# Patient Record
Sex: Female | Born: 1970 | Race: White | Hispanic: No
Health system: Southern US, Community
[De-identification: ages and names within clinical notes are randomized; demographics above are authoritative.]

---

## 2012-11-26 ENCOUNTER — Inpatient Hospital Stay: Payer: Self-pay | Admitting: Surgery

## 2012-11-26 LAB — CBC
HCT: 46.6 % (ref 35.0–47.0)
MCH: 30.2 pg (ref 26.0–34.0)
MCV: 89 fL (ref 80–100)
Platelet: 280 10*3/uL (ref 150–440)
RBC: 5.22 10*6/uL — ABNORMAL HIGH (ref 3.80–5.20)
RDW: 12.7 % (ref 11.5–14.5)
WBC: 9.6 10*3/uL (ref 3.6–11.0)

## 2012-11-26 LAB — URINALYSIS, COMPLETE
Bilirubin,UR: NEGATIVE
Blood: NEGATIVE
Glucose,UR: NEGATIVE mg/dL (ref 0–75)
Ph: 5 (ref 4.5–8.0)
Protein: 30
RBC,UR: NONE SEEN /HPF (ref 0–5)
Specific Gravity: 1.035 (ref 1.003–1.030)

## 2012-11-26 LAB — COMPREHENSIVE METABOLIC PANEL
Albumin: 3.7 g/dL (ref 3.4–5.0)
Alkaline Phosphatase: 86 U/L (ref 50–136)
Calcium, Total: 8.4 mg/dL — ABNORMAL LOW (ref 8.5–10.1)
Creatinine: 0.87 mg/dL (ref 0.60–1.30)
EGFR (Non-African Amer.): >60
Osmolality: 276 (ref 275–301)
Potassium: 3.3 mmol/L — ABNORMAL LOW (ref 3.5–5.1)
SGPT (ALT): 38 U/L (ref 12–78)

## 2012-11-26 LAB — LIPASE, BLOOD: Lipase: 97 U/L (ref 73–393)

## 2012-11-28 LAB — CBC WITH DIFFERENTIAL/PLATELET
Basophil #: 0 10*3/uL (ref 0.0–0.1)
Basophil %: 0.3 %
Eosinophil #: 0 10*3/uL (ref 0.0–0.7)
HCT: 41.9 % (ref 35.0–47.0)
HGB: 14 g/dL (ref 12.0–16.0)
Lymphocyte #: 1.4 10*3/uL (ref 1.0–3.6)
Lymphocyte %: 9.7 %
MCHC: 33.4 g/dL (ref 32.0–36.0)
MCV: 90 fL (ref 80–100)
Monocyte #: 0.8 x10 3/mm (ref 0.2–0.9)
Neutrophil #: 12.1 10*3/uL — ABNORMAL HIGH (ref 1.4–6.5)
Platelet: 218 10*3/uL (ref 150–440)
RDW: 12.9 % (ref 11.5–14.5)
WBC: 14.4 10*3/uL — ABNORMAL HIGH (ref 3.6–11.0)

## 2012-11-28 LAB — BASIC METABOLIC PANEL
Anion Gap: 5 — ABNORMAL LOW (ref 7–16)
Calcium, Total: 8.1 mg/dL — ABNORMAL LOW (ref 8.5–10.1)
Chloride: 107 mmol/L (ref 98–107)
Co2: 27 mmol/L (ref 21–32)
Creatinine: 0.65 mg/dL (ref 0.60–1.30)
EGFR (African American): >60
EGFR (Non-African Amer.): >60
Glucose: 130 mg/dL — ABNORMAL HIGH (ref 65–99)
Potassium: 4.6 mmol/L (ref 3.5–5.1)

## 2012-11-29 LAB — BASIC METABOLIC PANEL
Anion Gap: 3 — ABNORMAL LOW (ref 7–16)
BUN: 3 mg/dL — ABNORMAL LOW (ref 7–18)
Calcium, Total: 8.2 mg/dL — ABNORMAL LOW (ref 8.5–10.1)
Chloride: 107 mmol/L (ref 98–107)
Creatinine: 0.63 mg/dL (ref 0.60–1.30)
EGFR (African American): >60
Glucose: 127 mg/dL — ABNORMAL HIGH (ref 65–99)
Osmolality: 279 (ref 275–301)
Potassium: 4.1 mmol/L (ref 3.5–5.1)

## 2012-11-29 LAB — CBC WITH DIFFERENTIAL/PLATELET
Basophil #: 0 10*3/uL (ref 0.0–0.1)
Eosinophil #: 0 10*3/uL (ref 0.0–0.7)
HGB: 12.3 g/dL (ref 12.0–16.0)
Lymphocyte #: 0.8 10*3/uL — ABNORMAL LOW (ref 1.0–3.6)
MCV: 90 fL (ref 80–100)
WBC: 10.4 10*3/uL (ref 3.6–11.0)

## 2012-12-01 LAB — CBC WITH DIFFERENTIAL/PLATELET
Basophil #: 0 10*3/uL (ref 0.0–0.1)
Eosinophil #: 0 10*3/uL (ref 0.0–0.7)
Eosinophil %: 0 %
HCT: 36.2 % (ref 35.0–47.0)
HGB: 12.3 g/dL (ref 12.0–16.0)
MCHC: 33.9 g/dL (ref 32.0–36.0)
MCV: 89 fL (ref 80–100)
Monocyte #: 0.8 x10 3/mm (ref 0.2–0.9)
Neutrophil #: 3.5 10*3/uL (ref 1.4–6.5)
Neutrophil %: 64.2 %
Platelet: 283 10*3/uL (ref 150–440)
RBC: 4.07 10*6/uL (ref 3.80–5.20)
RDW: 12.8 % (ref 11.5–14.5)
WBC: 5.5 10*3/uL (ref 3.6–11.0)

## 2012-12-01 LAB — BASIC METABOLIC PANEL
Anion Gap: 5 — ABNORMAL LOW (ref 7–16)
Calcium, Total: 8.3 mg/dL — ABNORMAL LOW (ref 8.5–10.1)
Chloride: 105 mmol/L (ref 98–107)
Co2: 30 mmol/L (ref 21–32)
Creatinine: 0.6 mg/dL (ref 0.60–1.30)
EGFR (Non-African Amer.): >60
Osmolality: 277 (ref 275–301)

## 2012-12-05 LAB — CBC WITH DIFFERENTIAL/PLATELET
Basophil: 1 %
Comment - H1-Com2: NORMAL
HCT: 30.9 % — ABNORMAL LOW (ref 35.0–47.0)
HGB: 10.4 g/dL — ABNORMAL LOW (ref 12.0–16.0)
MCH: 29.9 pg (ref 26.0–34.0)
MCV: 89 fL (ref 80–100)
Monocytes: 4 %
Platelet: 335 10*3/uL (ref 150–440)
RBC: 3.49 10*6/uL — ABNORMAL LOW (ref 3.80–5.20)
WBC: 4.7 10*3/uL (ref 3.6–11.0)

## 2012-12-05 LAB — URINALYSIS, COMPLETE
Bilirubin,UR: NEGATIVE
Blood: NEGATIVE
Glucose,UR: NEGATIVE mg/dL (ref 0–75)
Protein: NEGATIVE
Specific Gravity: 1.003 (ref 1.003–1.030)
WBC UR: 1 /HPF (ref 0–5)

## 2012-12-12 ENCOUNTER — Ambulatory Visit: Payer: Self-pay | Admitting: Surgery

## 2014-09-15 IMAGING — CT CT ABD-PELV W/ CM
1 of 2 series · 15 of 32 positions shown, 19 images · non-contrast
Comparison: none

REASON FOR EXAM: (1) primarily epigastric pain, now diffuse abdominal
pain, hx of gastric bypass;
COMMENTS:

[Series 2: 3mm soft tissue · axial · 0.63mm/px · z∈[-500,-80]mm · 15 of 154 slices shown, 19 images]
[im 7/154  soft-tissue]
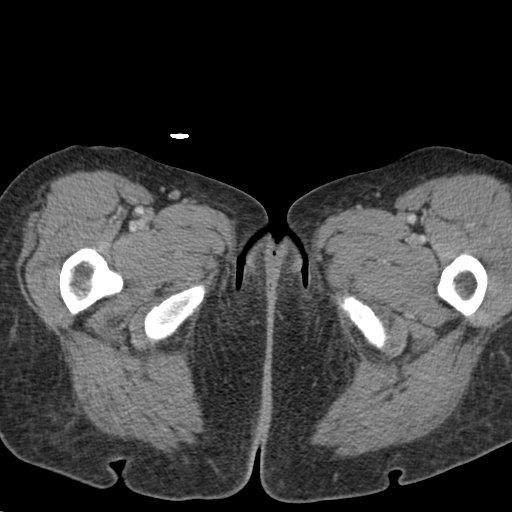
[im 7/154  bone]
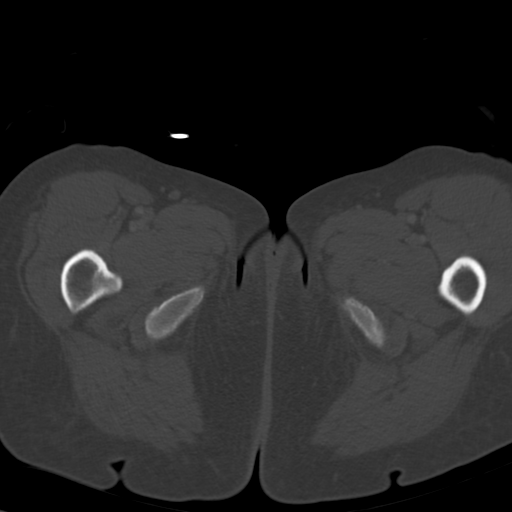
[im 19/154  soft-tissue]
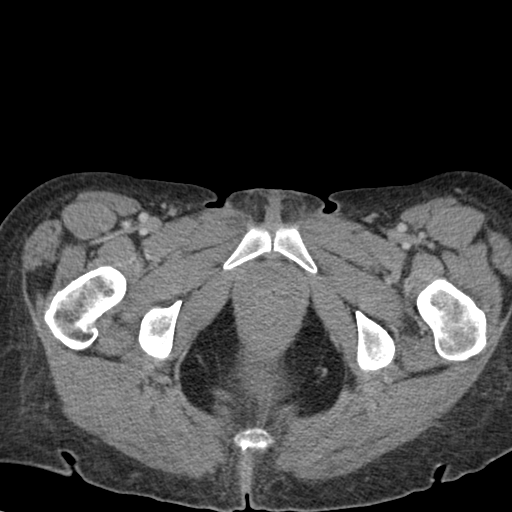
[im 31/154  soft-tissue]
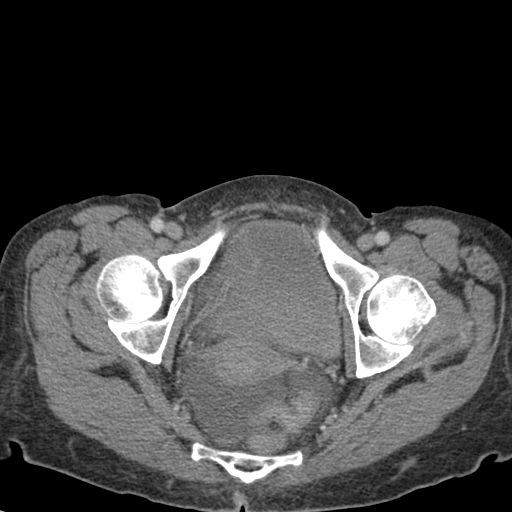
[im 43/154  soft-tissue]
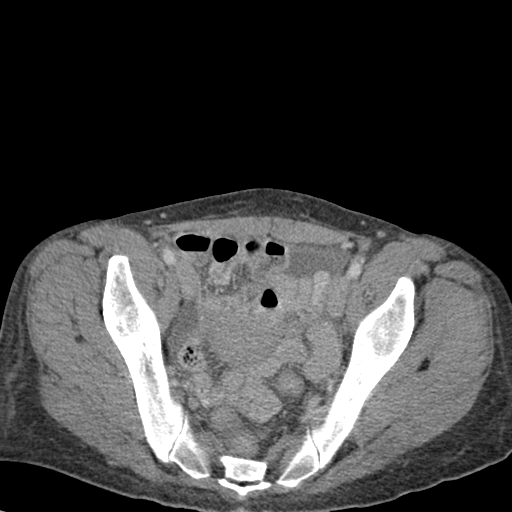
[im 56/154  soft-tissue]
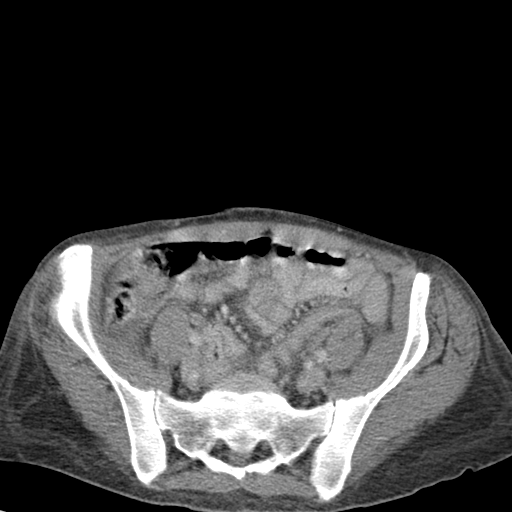
[im 68/154  soft-tissue]
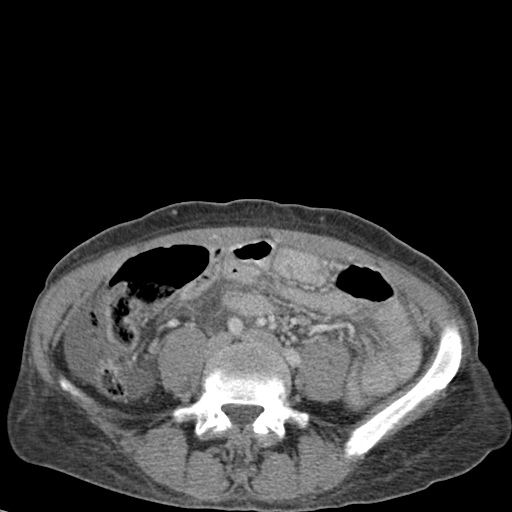
[im 80/154  soft-tissue]
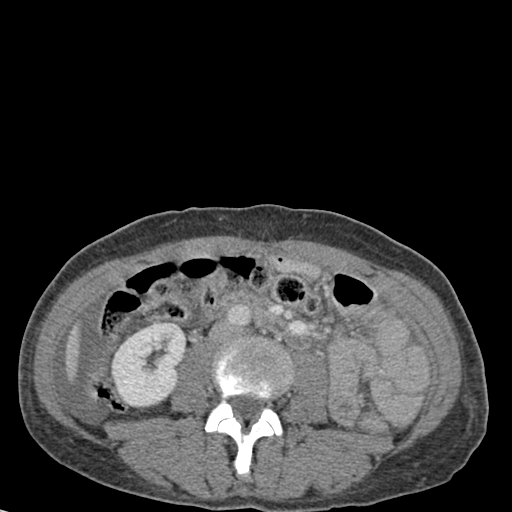
[im 86/154  soft-tissue]
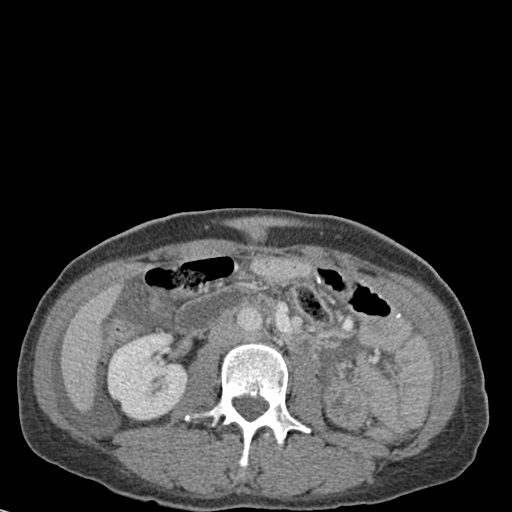
[im 98/154  soft-tissue]
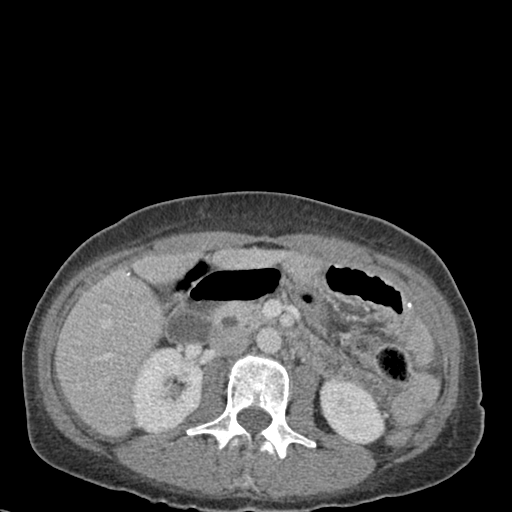
[im 98/154  bone]
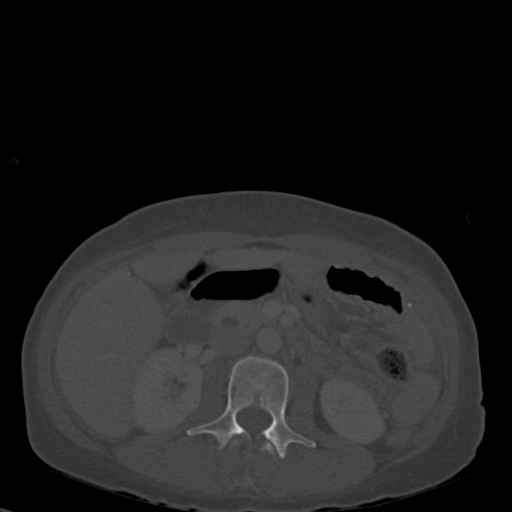
[im 111/154  soft-tissue]
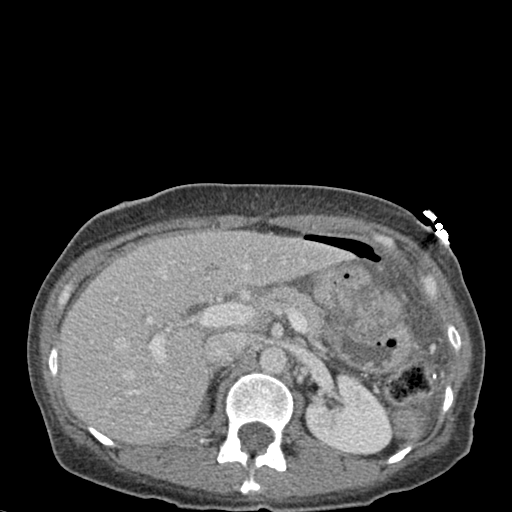
[im 123/154  soft-tissue]
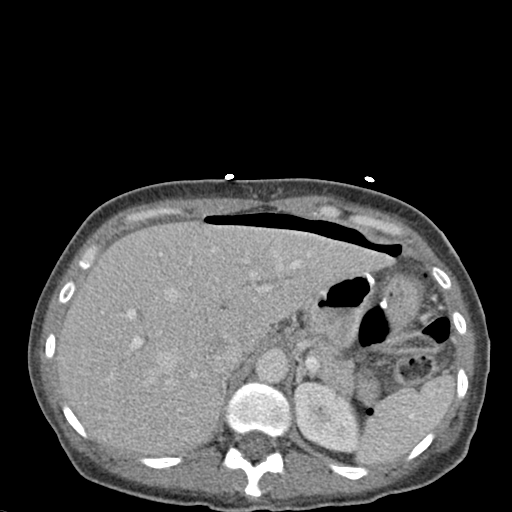
[im 129/154  lung]
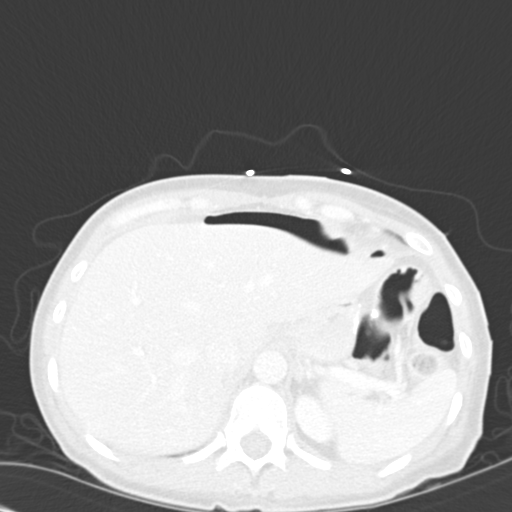
[im 135/154  soft-tissue]
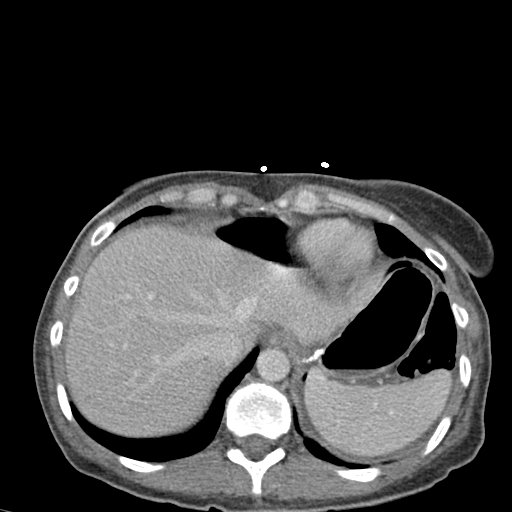
[im 135/154  lung]
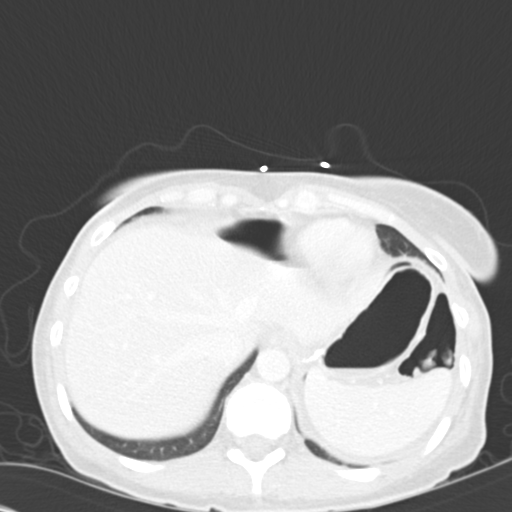
[im 141/154  lung]
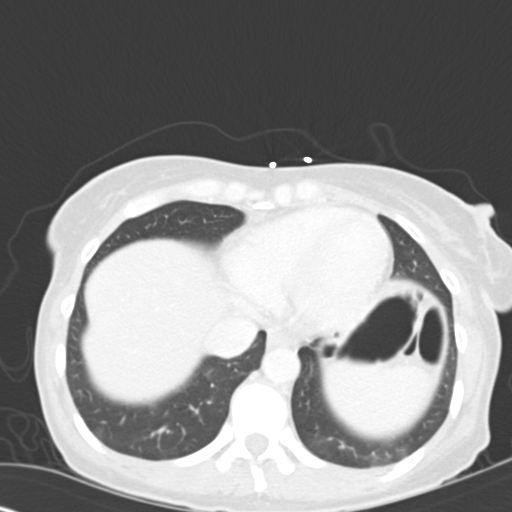
[im 147/154  soft-tissue]
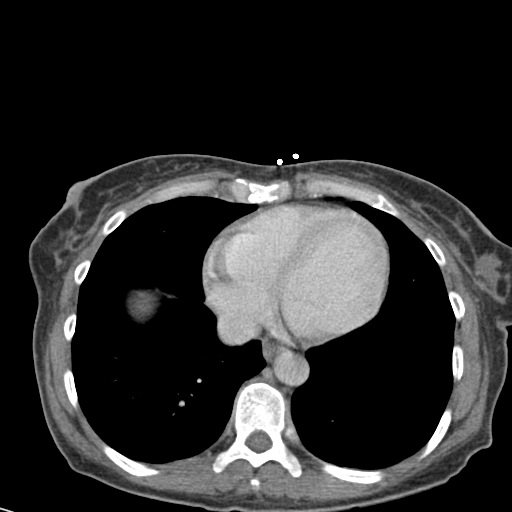
[im 147/154  lung]
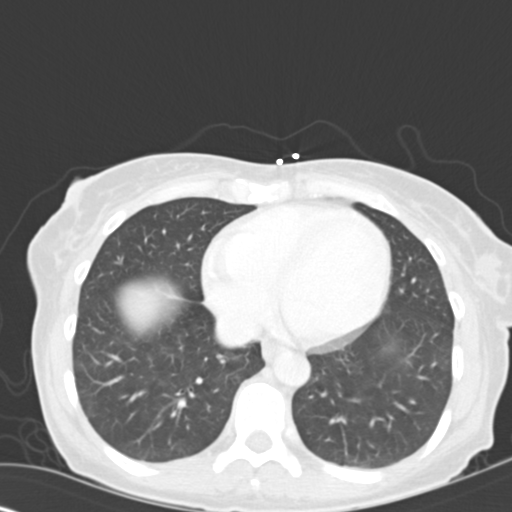

[15 of 32 positions shown; findings below may reference images not displayed]

PROCEDURE:     CT  - CT ABDOMEN / PELVIS  W  - November 26, 2012  [DATE]

RESULT:     History: Pain.

Comparison Study: No prior. Or Graf findings: Standard CT obtained with 100
cc of [id number]. Liver normal. Prior cholecystectomy. Minimal biliary
distention intrapelvically is noted. Common bile duct measures approximately
11 mm in diameter. Portal vein, hepatic veins, mesenteric vessels patent.
Free fluid is noted throughout the abdomen. Kidneys are normal. No
hydronephrosis or hydroureter. Bladder is nondistended. Air is noted in the
vagina. Air within the uterus cannot be entirely excluded. This is most
likely vaginal. Bilateral tubal ligations. No prominent bowel distention.
Stool noted in colon. Large amount of free peritoneal air is noted.
Patient's had prior gastric bypass. Appendix not visualized. Lung bases
clear.
IMPRESSION: Large amount of intraperitoneal free air. Intra-abdominal
free fluid is also present. Other findings as above. Report phoned to
patient's emergency room physician at time of study.

## 2014-10-01 IMAGING — CR DG CHEST 2V
1 series · 2 of 2 positions shown · non-contrast
Comparison: none

REASON FOR EXAM: Sefiifadini/Bluis Ison Repair
COMMENTS:

PROCEDURE:     DXR - DXR CHEST PA (OR AP) AND LATERAL  - December 12, 2012 [DATE]
RESULT:     Comparison is made to the previous exam dated 08 December, 2012.
The lungs are hyperinflated. The heart and pulmonary vessels are normal. The
bony and mediastinal structures are unremarkable.

[Series 3: w chest pa · 0.14mm/px · 2 of 2 slices shown]
[im 1/2]
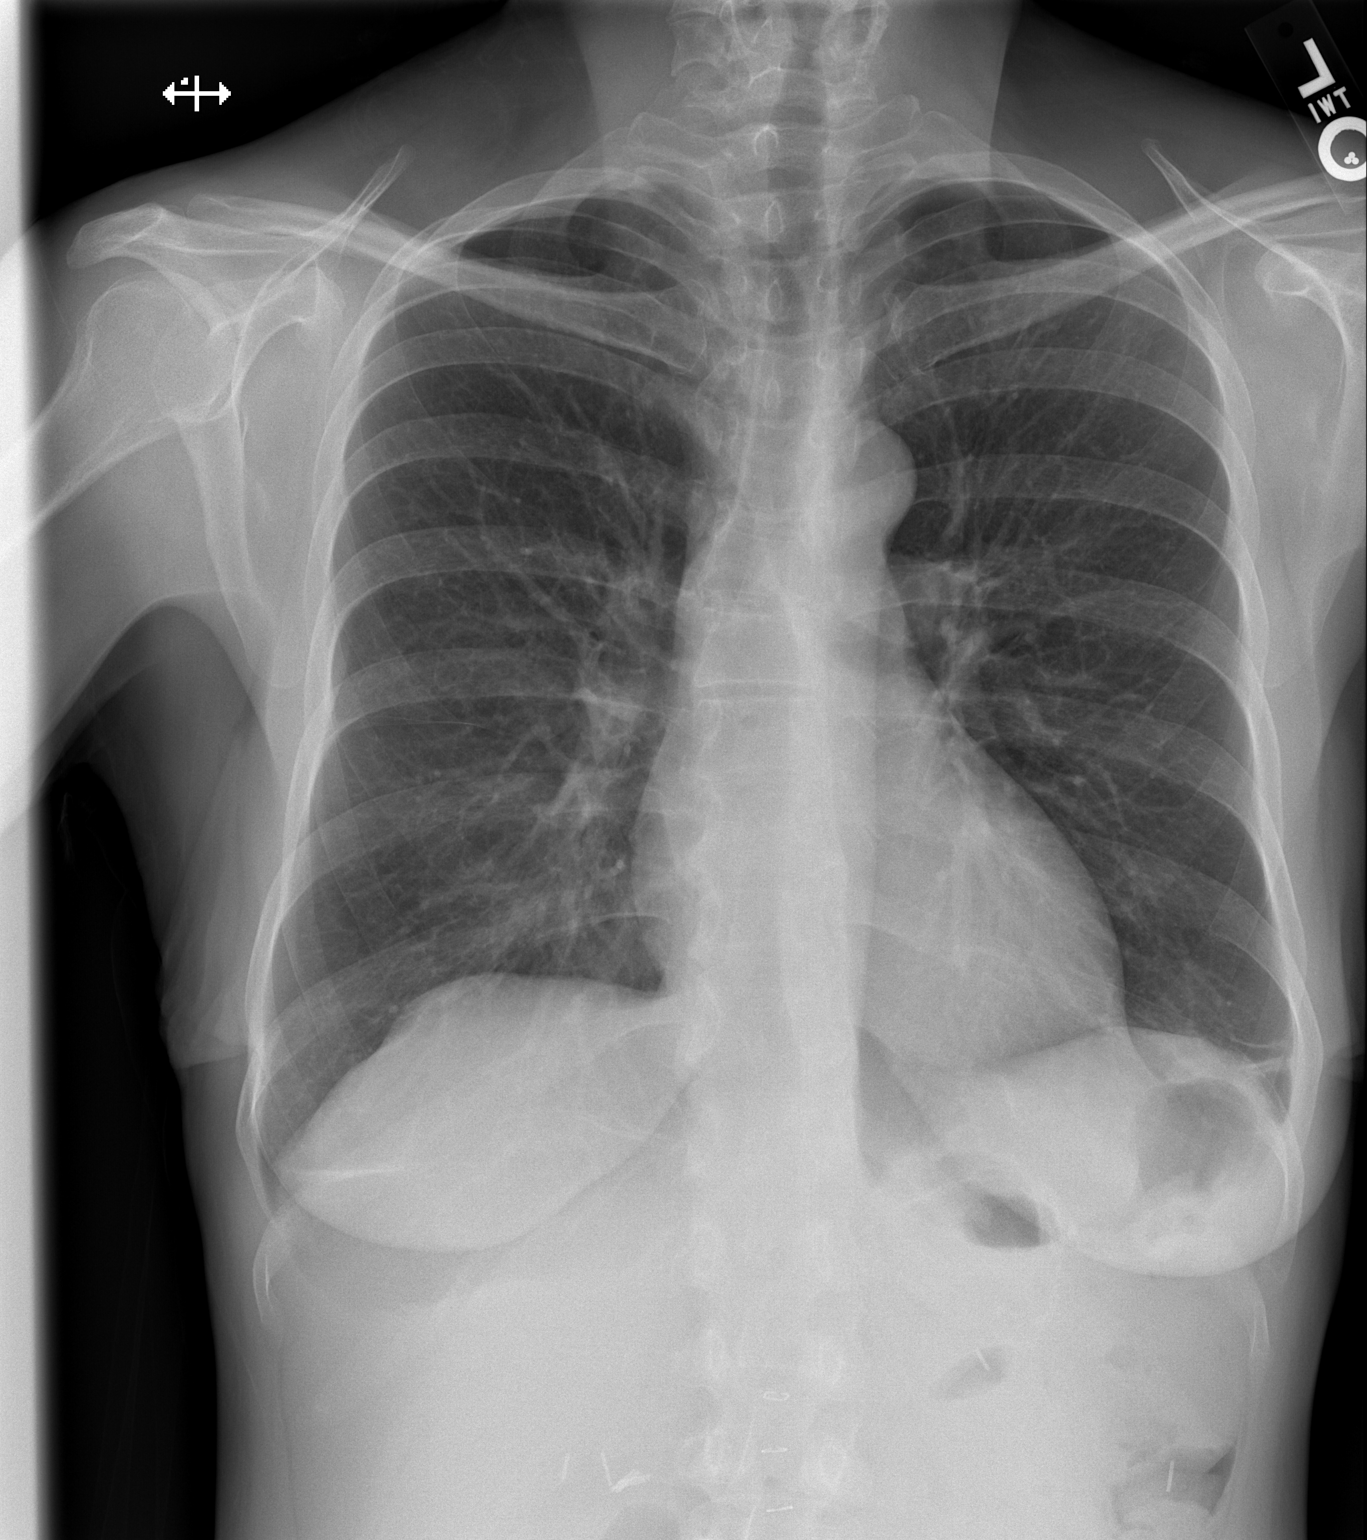
[im 2/2]
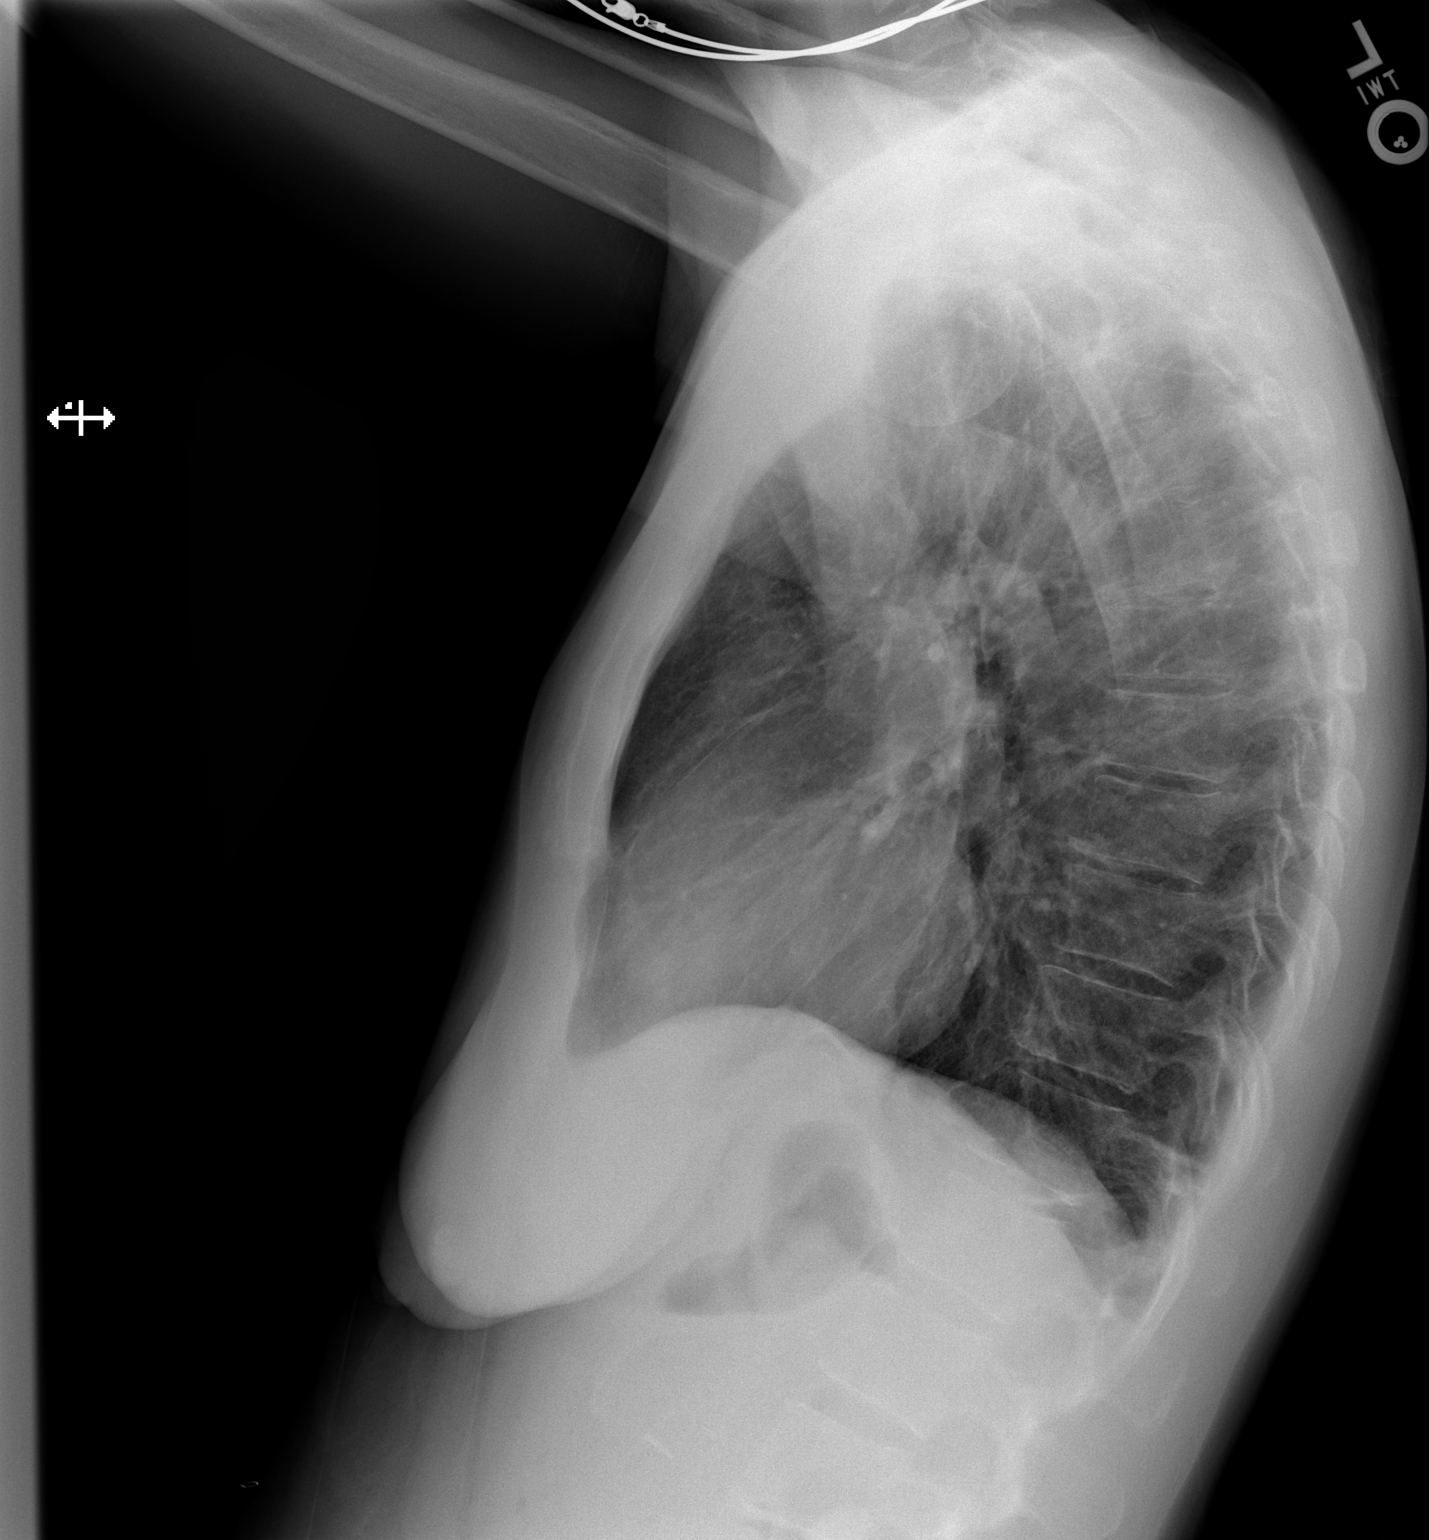

[2 of 2 positions shown; findings below may reference images not displayed]

IMPRESSION: 1. No acute cardiopulmonary disease or significant interval change.

[REDACTED]

## 2015-01-23 NOTE — Discharge Summary (Signed)
PATIENT NAME:  [name] Bowen, [name] Bowen MR#:  [id number] DATE OF BIRTH:  01-31-1971  DATE OF ADMISSION:  11/26/2012 DATE OF DISCHARGE:  12/08/2012  BRIEF HISTORY: The patient was 44 year old woman admitted through the Emergency Room with a perforated marginal ulcer. She has undergone gastric [place]. She had a postoperative complication, a marginal ulcer, which did heal on the re-evaluation. She presented to the Emergency Room here with sudden onset of abdominal pain with evidence of free air on her plain films and CT scan. Diagnosis of perforation was entertained and she was urged to have urgent surgical intervention. We did not think we could get her transferred in a timely to her primary surgeon. After appropriate preoperative preparation and informed consent, she was taken to surgery that evening where she underwent exploratory laparotomy. She did have a perforated marginal ulcer, which was closed with a serosal patch from the gastric remnant overlaid with omentum. A central line was placed and she was begun on TPN. She had significant pain control issues, as she has had long-standing pain problems with her musculoskeletal system prior to the surgical procedure. She is begun on liquid diet on March 2 and advanced to a soft diet. Pain control was finally achieved using p.o. medications. She is discharged home on the March 8, to be followed in the office in 7 to 10 days' time for further intervention and possible re-evaluation and possible consideration for endoscopy. We will also enlist the aid of her primary surgeon in [place].   FINAL DISCHARGE DIAGNOSIS: A perforated marginal ulcer.   SURGERY:  1.  A serosal patch from gastric remnant.  2.  Exploratory laparotomy.   DISCHARGE MEDICATIONS: Included:  Singulair 10 mg p.o. daily, Lamictal 150 mg b.i.d., doxepin 10 mg b.i.d., calcium twice a day, Biotin 1 tab b.i.d., Xanax 1 mg at bedtime p.r.n.,  fentanyl 75 mcg patch every 3 days, oxycodone  5 mg/5 mL every 3 hours p.r.n., diazepam 5 mg every 8 hours p.r.n., Protonix 1 tablet 40 mg b.i.d.    ____________________________ Carmie Endalph L. Ely III, MD rle:cc D: 12/17/2012 18:21:32 ET T: 12/17/2012 23:35:01 ET JOB#: [id number]  cc: Quentin Orealph L. Ely III, MD, <Dictator> Quentin OreALPH L ELY MD ELECTRONICALLY SIGNED 12/20/2012 10:39

## 2015-01-23 NOTE — Consult Note (Signed)
[place]Dr Mickie Kayavid meyers  [facility] in [place] ho Seizure dopo lamictal 150 mg po bid. has been seizure free for several years. exact etio uncler but pt reports it to be due to strss/anxiety inducedchange to IV keppra to bridge till able to take po meds and then resume po lamictal when able to take orally. perforated viscous due to perforated ulcerdr elymorhine pump xanax or ativan abusecessation 3 mins counselling doneis agreeable dr Mickie Kaydavid meyers (neuro)ptyou for the consult   Electronic Signatures: Willow OraPatel, Dorse Locy A (MD)  (Signed on 25-Feb-14 08:39)  Authored  Last Updated: 25-Feb-14 08:39 by Willow OraPatel, Kayliah Tindol A (MD)

## 2015-01-23 NOTE — Op Note (Signed)
PATIENT NAME:  Bowen Bowen MR#:  [id number] DATE OF BIRTH:  Sep 15, 1971  DATE OF PROCEDURE:  11/26/2012  PREOPERATIVE DIAGNOSIS:  Perforated viscus.   POSTOPERATIVE DIAGNOSIS:  Perforated marginal ulcer.   PROCEDURE: 1.  Serosal patch.  2.  Cheree DittoGraham patch.  3.  Central line insertion.   SURGEON: Quentin Orealph L. Ely.   ANESTHESIA: General.   DESCRIPTION OF PROCEDURE: With the patient in the supine position after the induction of appropriate anesthesia and placement of a Foley catheter, the patient's abdomen was prepped with Betadine and draped with sterile towels. An alcohol wipe and Betadine impregnated Steri-Drape were utilized. Upper midline incision was made from the subxiphoid area to just above the umbilicus and carried down through the subcutaneous tissue with Bovie electrocautery. Midline fascia was identified and opened the length of the skin incision as was the peritoneum. A large amount of purulent free fluid was identified. There was significant fibrinous exudate over a large portion of the colon and small bowel.  The small bowel was elevated into the incision and the previously created Roux-en-Y anastomosis identified. It appeared to be intact. The small intestine was followed distally to the cecum and did not appear to have any evidence of abnormality. The bowel all appeared to be uninvolved but did fibrinous exudate over it. The limb was then followed proximally to where it joined the stomach. The gastric remnant was identified and in evaluating the gastric remnant, a marginal ulcer was identified between the Roux loop and the remaining functional stomach. The detached gastric remnant it was also adherent to the ulcer in the area of the leak. The leak was photographed. There was approximately a centimeter in size. There appeared to be adequate opening through both the remaining stomach and small intestine.   Because of the contamination, I was concerned about the risk of creating another  gastrojejunostomy. For that reason, I brought the gastric remnant which was adherent to the small bowel and remaining stomach over the ulcer in a serosal patch using 3-0 silk. I then encased the area with omentum using 3-0 Vicryl to keep it in place.  The abdomen was then copiously irrigated with warm saline solution. I did not attempt to perform a repeat gastrojejunostomy. Seprafilm was then placed over the small bowel contents in case future surgery was necessary. The midline fascia was closed with running suture of 0 PDS in a loop fashion. The sutures were started at each end and tied in the middle. The skin was clipped. Sterile dressings were applied.   A right internal jugular central line was placed using ultrasound guidance a single pass and the line sutured to the neck without difficulty. Sterile dressing was applied. The patient was then returned to the recovery room having tolerated the procedure well. Sponge, instrument and needle counts were correct x 2 in the operating room.    ____________________________ Quentin Orealph L. Ely III, MD rle:ct D: 11/26/2012 23:39:53 ET T: 11/27/2012 09:48:06 ET JOB#: [id number]  cc: Quentin Orealph L. Ely III, MD, <Dictator> Quentin OreALPH L ELY MD ELECTRONICALLY SIGNED 11/27/2012 23:19

## 2015-01-23 NOTE — Consult Note (Signed)
PATIENT NAME:  [name] Bowen, [name] Bowen MR#:  [id number] DATE OF BIRTH:  05/10/1971  DATE OF CONSULTATION:  11/27/2012  REFERRING PHYSICIAN:  Quentin Ore, III, MD  CONSULTING PHYSICIAN:  Cherye Gaertner A. Allena Katz, MD PRIMARY CARE PHYSICIAN: In [place]  NEUROLOGIST: Dr. Marlena Clipper, [facility] in [facility]   REASON FOR CONSULTATION: Management of seizure medications. The patient is postoperative day 1.   HISTORY OF PRESENT ILLNESS: The patient is a pleasant 44 year old Caucasian female who has history of seizure disorder for many years for unclear etiology other than the patient telling me due to stress-induced, along with history of tobacco abuse, and chronic pain on narcotics with fentanyl and p.r.n. Percocet, comes to the Emergency Room on 11/26/2012 with significant abdominal pain and underwent CT scan of the abdomen which showed free air. The patient was seen by Dr. Michela Pitcher, taken for urgent exploratory laparotomy, and was found to have perforated viscus secondary to ulcer. She is postop day 1.  Internal Medicine was consulted for management of seizure medication.   The patient states she has not had seizure in many years. She is taking Lamictal 150 mg b.i.d.     PAST MEDICAL HISTORY: 1. History of seizure disorder.  2. Tobacco abuse.  3. Chronic pain with narcotic dependence.  4. Anxiety.   ALLERGIES: AVELOX, ROBAXIN, SULFA AND ULTRAM.   SOCIAL HISTORY: She smokes about a pack a day, trying to quit. Social drinker. No illicit drug use.   FAMILY HISTORY: Positive for stroke and hypertension.  REVIEW OF SYSTEMS:   CONSTITUTIONAL: No fever. Positive for fatigue, weakness.  EYES: No blurred or double vision or glaucoma.  ENT: No tinnitus, ear pain, hearing loss.  RESPIRATORY: No cough, wheeze, hemoptysis.  CARDIOVASCULAR: No chest pain, dyspnea on exertion or hypertension.  RESPIRATORY: No shortness of breath, COPD, hemoptysis or cough.  GASTROINTESTINAL: No nausea, vomiting,  diarrhea, Positive for abdominal pain at the surgical site.  GENITOURINARY: No dysuria or hematuria.  ENDOCRINE: No polyuria or nocturia.  HEMATOLOGY: No anemia or easy bruising.  SKIN: No acne or rash.  NEUROLOGICAL: No CVA or TIA.  PSYCHIATRIC: Positive for anxiety.  All other systems reviewed and negative.   PHYSICAL EXAMINATION: GENERAL: The patient is awake, alert, oriented x3, mild-to-moderate distress due to abdominal pain.  VITAL SIGNS: Temperature is 98.5, pulse is 98, blood pressure is 141/83, sats are 96% on room air.  HEENT: Atraumatic, normocephalic. PERRLA. EOM intact. Oral mucosa is moist.  NECK:  Supple, no JVD. No carotid bruit.  RESPIRATORY: Clear to auscultation bilaterally. No rales, rhonchi, respiratory distress or labored breathing.  CARDIOVASCULAR: Both the heart sounds are normal. Rate is tachycardic. Rhythm is regular. PMI not lateralized. Chest is nontender.  EXTREMITIES: Good pedal pulses, good femoral pulses. No lower extremity edema.  ABDOMEN: Soft. There is a surgical dressing present in the midline. No bowel sounds heard. NEUROLOGICAL: Grossly intact cranial nerves II through XII. No motor or sensory deficits.  SKIN: Warm and dry.  PSYCHIATRIC: The patient is awake, alert, oriented x3.   LABORATORY AND RADIOLOGICAL DATA:  Urinalysis: Mild UTI. White count is 9.6, H and H is 15.8 and 46.6, platelet count is 280. Comprehensive metabolic panel is within normal limits except potassium of 3.3. Lipase is 97.   Chest x-ray: No acute cardiopulmonary abnormality. CT of the abdomen shows intra-abdominal free air.    ASSESSMENT AND PLAN: The patient is a 44 year old with a history of seizure disorder, tobacco abuse, anxiety and chronic pain syndrome with chronic  pain meds, is admitted with perforated viscus, and Internal Medicine is consulted for:   1. History of seizure disorder: The patient takes p.o. Lamictal 150 b.i.d., has been seizure-free for several years.  Exact etiology is unclear, but the patient reports it to be due to stress, anxiety and overwork. We will cover the patient right now with IV Keppra since there is no IV form for Lamictal to bridge until she is able to take p.o. meds and then resume p.o. Lamictal when able to take orally. The above was discussed with Dr. Mickie Kayavid Meyers, [facility], the patient's primary neurologist.  2. Status post perforated viscus due to perforated ulcer, per Dr. Michela PitcherEly: The patient is currently on morphine pump.  3. Anxiety:  Xanax and IV Ativan p.r.n.  4. Tobacco abuse: Smoking cessation counseled, about 3 to 4 minutes spent. The patient is agreeable.  5. Deep vein thrombosis prophylaxis, per Dr. Michela PitcherEly: The patient is on Lovenox subcutaneous.   Thank you for the consult. We will follow while the patient is in house.   TIME SPENT: 50 minutes.   ____________________________ Wylie HailSona A. Allena KatzPatel, MD sap:cb D: 11/27/2012 08:47:53 ET T: 11/27/2012 10:29:01 ET JOB#: [id number]  cc: Afrika Brick A. Allena KatzPatel, MD, <Dictator> [facility] [place], Dr. Marlena Clipperavid Meyer Willow OraSONA A Ernestyne Caldwell MD ELECTRONICALLY SIGNED 11/29/2012 14:33

## 2017-11-03 DEATH — deceased
